# Patient Record
Sex: Male | Born: 1970 | Race: Black or African American | Hispanic: No | Marital: Married | Smoking: Current every day smoker
Health system: Southern US, Community
[De-identification: ages and names within clinical notes are randomized; demographics above are authoritative.]

## PROBLEM LIST (undated history)

## (undated) DIAGNOSIS — K219 Gastro-esophageal reflux disease without esophagitis: Secondary | ICD-10-CM

## (undated) DIAGNOSIS — M719 Bursopathy, unspecified: Secondary | ICD-10-CM

## (undated) DIAGNOSIS — E119 Type 2 diabetes mellitus without complications: Secondary | ICD-10-CM

## (undated) DIAGNOSIS — M545 Low back pain, unspecified: Secondary | ICD-10-CM

## (undated) DIAGNOSIS — I10 Essential (primary) hypertension: Secondary | ICD-10-CM

## (undated) DIAGNOSIS — K449 Diaphragmatic hernia without obstruction or gangrene: Secondary | ICD-10-CM

## (undated) DIAGNOSIS — G8929 Other chronic pain: Secondary | ICD-10-CM

## (undated) DIAGNOSIS — K2210 Ulcer of esophagus without bleeding: Secondary | ICD-10-CM

## (undated) DIAGNOSIS — E785 Hyperlipidemia, unspecified: Secondary | ICD-10-CM

## (undated) HISTORY — DX: Diaphragmatic hernia without obstruction or gangrene: K44.9

## (undated) HISTORY — DX: Low back pain: M54.5

## (undated) HISTORY — DX: Low back pain, unspecified: M54.50

## (undated) HISTORY — PX: SHOULDER SURGERY: SHX246

## (undated) HISTORY — DX: Hyperlipidemia, unspecified: E78.5

## (undated) HISTORY — PX: OTHER SURGICAL HISTORY: SHX169

## (undated) HISTORY — DX: Type 2 diabetes mellitus without complications: E11.9

## (undated) HISTORY — DX: Bursopathy, unspecified: M71.9

## (undated) HISTORY — PX: UMBILICAL HERNIA REPAIR: SHX196

## (undated) HISTORY — DX: Other chronic pain: G89.29

## (undated) HISTORY — PX: COLONOSCOPY: SHX174

## (undated) HISTORY — DX: Essential (primary) hypertension: I10

## (undated) HISTORY — DX: Ulcer of esophagus without bleeding: K22.10

---

## 2008-03-25 ENCOUNTER — Emergency Department (HOSPITAL_COMMUNITY): Admission: EM | Admit: 2008-03-25 | Discharge: 2008-03-25 | Payer: Self-pay | Admitting: Emergency Medicine

## 2008-03-26 ENCOUNTER — Emergency Department (HOSPITAL_COMMUNITY): Admission: EM | Admit: 2008-03-26 | Discharge: 2008-03-26 | Payer: Self-pay | Admitting: Emergency Medicine

## 2008-09-08 ENCOUNTER — Emergency Department (HOSPITAL_COMMUNITY): Admission: EM | Admit: 2008-09-08 | Discharge: 2008-09-08 | Payer: Self-pay | Admitting: Emergency Medicine

## 2008-09-08 ENCOUNTER — Telehealth (INDEPENDENT_AMBULATORY_CARE_PROVIDER_SITE_OTHER): Payer: Self-pay | Admitting: *Deleted

## 2008-09-09 ENCOUNTER — Encounter (INDEPENDENT_AMBULATORY_CARE_PROVIDER_SITE_OTHER): Payer: Self-pay | Admitting: *Deleted

## 2008-09-09 ENCOUNTER — Encounter: Payer: Self-pay | Admitting: Internal Medicine

## 2008-09-10 ENCOUNTER — Ambulatory Visit: Payer: Self-pay | Admitting: Internal Medicine

## 2008-09-10 ENCOUNTER — Encounter: Payer: Self-pay | Admitting: Internal Medicine

## 2008-09-10 ENCOUNTER — Ambulatory Visit (HOSPITAL_COMMUNITY): Admission: RE | Admit: 2008-09-10 | Discharge: 2008-09-10 | Payer: Self-pay | Admitting: Internal Medicine

## 2008-09-10 DIAGNOSIS — K2210 Ulcer of esophagus without bleeding: Secondary | ICD-10-CM

## 2008-09-10 HISTORY — DX: Ulcer of esophagus without bleeding: K22.10

## 2008-09-10 HISTORY — PX: ESOPHAGOGASTRODUODENOSCOPY: SHX1529

## 2008-12-09 ENCOUNTER — Ambulatory Visit: Payer: Self-pay | Admitting: Internal Medicine

## 2008-12-09 DIAGNOSIS — G43909 Migraine, unspecified, not intractable, without status migrainosus: Secondary | ICD-10-CM | POA: Insufficient documentation

## 2008-12-09 DIAGNOSIS — M549 Dorsalgia, unspecified: Secondary | ICD-10-CM | POA: Insufficient documentation

## 2008-12-09 DIAGNOSIS — J45909 Unspecified asthma, uncomplicated: Secondary | ICD-10-CM | POA: Insufficient documentation

## 2008-12-09 DIAGNOSIS — J40 Bronchitis, not specified as acute or chronic: Secondary | ICD-10-CM

## 2008-12-10 DIAGNOSIS — K219 Gastro-esophageal reflux disease without esophagitis: Secondary | ICD-10-CM

## 2009-02-06 ENCOUNTER — Ambulatory Visit: Payer: Self-pay | Admitting: Internal Medicine

## 2009-02-06 DIAGNOSIS — K210 Gastro-esophageal reflux disease with esophagitis: Secondary | ICD-10-CM

## 2009-06-25 ENCOUNTER — Emergency Department (HOSPITAL_COMMUNITY): Admission: EM | Admit: 2009-06-25 | Discharge: 2009-06-25 | Payer: Self-pay | Admitting: Emergency Medicine

## 2009-08-12 ENCOUNTER — Emergency Department (HOSPITAL_COMMUNITY): Admission: EM | Admit: 2009-08-12 | Discharge: 2009-08-12 | Payer: Self-pay | Admitting: Emergency Medicine

## 2010-03-09 ENCOUNTER — Encounter (INDEPENDENT_AMBULATORY_CARE_PROVIDER_SITE_OTHER): Payer: Self-pay | Admitting: *Deleted

## 2010-05-25 ENCOUNTER — Emergency Department (HOSPITAL_COMMUNITY): Admission: EM | Admit: 2010-05-25 | Discharge: 2010-05-25 | Payer: Self-pay | Admitting: Emergency Medicine

## 2010-05-25 ENCOUNTER — Ambulatory Visit: Payer: Self-pay | Admitting: Internal Medicine

## 2010-08-03 NOTE — Letter (Signed)
Summary: Recall Office Visit  [facility]  [address]   [place], [place] [address]   Phone: [phone]  Fax: [phone]      March 09, 2010   Gabriel Mendoza [address] [place], [place]  [address] [birth year]   Dear Mr. Creveling,   According to our records, it is time for you to schedule a follow-up office visit with [place].   At your convenience, please call [phone] to schedule an office visit. If you have any questions, concerns, or feel that this letter is in error, we would appreciate your call.   Sincerely,    Diana Eves  [facility] Ph: [phone]   Fax: [phone]
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
## 2010-10-14 LAB — URINALYSIS, ROUTINE W REFLEX MICROSCOPIC
Glucose, UA: NEGATIVE mg/dL
Hgb urine dipstick: NEGATIVE
Specific Gravity, Urine: 1.02 (ref 1.005–1.030)

## 2010-10-14 LAB — BASIC METABOLIC PANEL
BUN: 13 mg/dL (ref 6–23)
CO2: 24 mEq/L (ref 19–32)
GFR calc non Af Amer: >60 mL/min (ref >60)
Glucose, Bld: 125 mg/dL — ABNORMAL HIGH (ref 70–99)
Potassium: 3.7 mEq/L (ref 3.5–5.1)
Sodium: 137 mEq/L (ref 135–145)

## 2010-10-14 LAB — URINE MICROSCOPIC-ADD ON

## 2010-10-14 LAB — RAPID URINE DRUG SCREEN, HOSP PERFORMED
Cocaine: NOT DETECTED
Opiates: NOT DETECTED

## 2010-10-14 LAB — DIFFERENTIAL
Basophils Absolute: 0 10*3/uL (ref 0.0–0.1)
Basophils Relative: 0 % (ref 0–1)
Eosinophils Absolute: 0.2 10*3/uL (ref 0.0–0.7)
Eosinophils Relative: 1 % (ref 0–5)
Lymphocytes Relative: 10 % — ABNORMAL LOW (ref 12–46)
Monocytes Absolute: 1 10*3/uL (ref 0.1–1.0)

## 2010-10-14 LAB — CBC
HCT: 48.1 % (ref 39.0–52.0)
Hemoglobin: 16.2 g/dL (ref 13.0–17.0)
MCHC: 33.7 g/dL (ref 30.0–36.0)
MCV: 91.3 fL (ref 78.0–100.0)
Platelets: 236 10*3/uL (ref 150–400)
RDW: 14.8 % (ref 11.5–15.5)

## 2010-10-14 LAB — H. PYLORI ANTIBODY, IGG: H Pylori IgG: 0.5 {ISR}
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
## 2011-03-28 ENCOUNTER — Ambulatory Visit: Payer: Self-pay | Admitting: Gastroenterology

## 2011-03-30 ENCOUNTER — Ambulatory Visit: Payer: Self-pay | Admitting: Gastroenterology

## 2011-03-31 ENCOUNTER — Ambulatory Visit (INDEPENDENT_AMBULATORY_CARE_PROVIDER_SITE_OTHER): Payer: Medicaid - Out of State | Admitting: Urgent Care

## 2011-03-31 ENCOUNTER — Encounter: Payer: Self-pay | Admitting: Urgent Care

## 2011-03-31 DIAGNOSIS — R1310 Dysphagia, unspecified: Secondary | ICD-10-CM | POA: Insufficient documentation

## 2011-03-31 DIAGNOSIS — K219 Gastro-esophageal reflux disease without esophagitis: Secondary | ICD-10-CM

## 2011-03-31 MED ORDER — OMEPRAZOLE 20 MG PO CPDR: 20.0000 mg | DELAYED_RELEASE_CAPSULE | Freq: Two times a day (BID) | ORAL | Status: DC

## 2011-03-31 NOTE — Assessment & Plan Note (Signed)
Hx ulcerative reflux esophagitis, not on PPI.  Symptomatic. Encouraged to resume PPI. Trial Omeprazole 20mg  BID

## 2011-03-31 NOTE — Progress Notes (Signed)
No PCP on file 

## 2011-03-31 NOTE — Patient Instructions (Addendum)
1-800-Quit-Now to quit smoking Make an appt to set up a primary care physician Begin omeprazole (prilosec) 20md before breakfast & dinner. Call me in 2 weeks if acid reflux & swallowing is not better. Try to lose weight 1-2 # PER WEEK Office visit 3 months  Acid Reflux (GERD) Acid reflux is also called gastroesophageal reflux disease (GERD). Your stomach makes acid to help digest food. Acid reflux happens when acid from your stomach goes into the tube between your mouth and stomach (esophagus). Your stomach is protected from the acid, but this tube is not. When acid gets into the tube, it may cause a burning feeling in the chest (heartburn). Besides heartburn, other health problems can happen if the acid keeps going into the tube. Some causes of acid reflux include:  Being overweight.   Smoking.   Drinking alcohol.   Eating large meals.   Eating meals and then going to bed right away.   Eating certain foods.   Increased stomach acid production.  HOME CARE  Take all medicine as told by your doctor.   You may need to:   Lose weight.   Avoid alcohol.   Quit smoking.   Do not eat big meals. It is better to eat smaller meals throughout the day.   Do not eat a meal and then nap or go to bed.   Sleep with your head higher than your stomach.   Avoid foods that bother you.   You may need more tests, or you may need to see a special doctor.  GET HELP RIGHT AWAY IF:  You have chest pain that is different than before.   You have pain that goes to your arms, jaw, or between your shoulder blades.   You throw up (vomit) blood, dark brown liquid, or your throw up looks like coffee grounds.   You have trouble swallowing.   You have trouble breathing or cannot stop coughing.   You feel dizzy or pass out.   Your skin is cool, wet, and pale.   Your medicine is not helping.  MAKE SURE YOU:   Understand these instructions.   Will watch your condition.   Will get help  right away if you are not doing well or get worse.  Document Released: 12/07/2007 Document Re-Released: 09/14/2009 [facility] Patient Information 2011 [place], [place].
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
## 2011-03-31 NOTE — Assessment & Plan Note (Addendum)
Resume PPI, if no better, please call back to discuss EGD w/ possible esophageal dilation.  1-800-Quit-Now to quit smoking Make an appt to set up a primary care physician Begin omeprazole (prilosec) 20mg  before breakfast & dinner. Call me in 2 weeks if acid reflux & swallowing is not better. Try to lose weight 1-2 # PER WEEK Office visit 3 months Acid Reflux (GERD) literature

## 2011-05-24 ENCOUNTER — Encounter: Payer: Self-pay | Admitting: Gastroenterology

## 2011-05-30 NOTE — Progress Notes (Signed)
REVIEWED.  

## 2011-06-30 ENCOUNTER — Encounter: Payer: Self-pay | Admitting: Gastroenterology

## 2011-06-30 ENCOUNTER — Ambulatory Visit (INDEPENDENT_AMBULATORY_CARE_PROVIDER_SITE_OTHER): Payer: Medicaid - Out of State | Admitting: Gastroenterology

## 2011-06-30 ENCOUNTER — Ambulatory Visit: Payer: Medicaid - Out of State | Admitting: Gastroenterology

## 2011-06-30 ENCOUNTER — Ambulatory Visit: Payer: Medicaid - Out of State | Admitting: Urgent Care

## 2011-06-30 VITALS — BP 120/76 | HR 87 | Temp 99.0°F | Ht 72.0 in | Wt 263.4 lb

## 2011-06-30 DIAGNOSIS — R1013 Epigastric pain: Secondary | ICD-10-CM

## 2011-06-30 DIAGNOSIS — R1310 Dysphagia, unspecified: Secondary | ICD-10-CM

## 2011-06-30 NOTE — Patient Instructions (Addendum)
Continue to take Prilosec daily. Avoid Ibuprofen, Advil, Aleve, or anything of that nature. Just take tylenol products. You may want to try Zyrtec over-the-counter for sinus issues.   We have set you up for an upper endoscopy with Dr. Jena Gauss in the very near future.   Please have blood work completed. We will call you with the results.   Have a Happy New Year!
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
## 2011-07-01 DIAGNOSIS — R1013 Epigastric pain: Secondary | ICD-10-CM | POA: Insufficient documentation

## 2011-07-01 NOTE — Assessment & Plan Note (Addendum)
40 year old male with epigastric pain in the setting of NSAIDs 4 out of 7 days a week due to headaches. On Prilosec BID, no improvement in epigastric pain, GERD, dysphagia. Pain worsened with eating. Underlying nausea, worsened with eating as well. Question NSAID gastritis, PUD. Dysphagia may be due to uncontrolled GERD, esophageal web, ring, or stricture. Will proceed with EGD/ED in near future. Last EGD in early 2010 with hiatal hernia, excoriated proximal gastric mucosa, bulbar erosions, negative H.pylori.  Proceed with upper endoscopy in the near future with Dr. Jena Gauss. The risks, benefits, and alternatives have been discussed in detail with patient. They have stated understanding and desire to proceed.  CBC, CMP for completeness AVOID NSAIDs

## 2011-07-04 NOTE — Progress Notes (Signed)
No PCP on file 

## 2011-07-14 ENCOUNTER — Encounter (HOSPITAL_COMMUNITY): Payer: Self-pay | Admitting: Pharmacy Technician

## 2011-07-15 MED ORDER — SODIUM CHLORIDE 0.45 % IV SOLN
Freq: Once | INTRAVENOUS | Status: AC
  Administered 2011-07-18: 20 mL via INTRAVENOUS

## 2011-07-18 ENCOUNTER — Encounter (HOSPITAL_COMMUNITY)
Admission: RE | Disposition: A | Discharge status: Home / Self Care | Payer: Self-pay | Source: Ambulatory Visit | Attending: Internal Medicine

## 2011-07-18 ENCOUNTER — Ambulatory Visit (HOSPITAL_COMMUNITY)
Admission: RE | Admit: 2011-07-18 | Discharge: 2011-07-18 | Disposition: A | Discharge status: Home / Self Care | Payer: Medicaid - Out of State | Source: Ambulatory Visit | Attending: Internal Medicine | Admitting: Internal Medicine

## 2011-07-18 ENCOUNTER — Encounter (HOSPITAL_COMMUNITY): Payer: Self-pay | Admitting: *Deleted

## 2011-07-18 DIAGNOSIS — K2100 Gastro-esophageal reflux disease with esophagitis, without bleeding: Secondary | ICD-10-CM | POA: Insufficient documentation

## 2011-07-18 DIAGNOSIS — K219 Gastro-esophageal reflux disease without esophagitis: Secondary | ICD-10-CM

## 2011-07-18 DIAGNOSIS — K449 Diaphragmatic hernia without obstruction or gangrene: Secondary | ICD-10-CM | POA: Insufficient documentation

## 2011-07-18 DIAGNOSIS — R1310 Dysphagia, unspecified: Secondary | ICD-10-CM

## 2011-07-18 DIAGNOSIS — K222 Esophageal obstruction: Secondary | ICD-10-CM | POA: Insufficient documentation

## 2011-07-18 DIAGNOSIS — R1013 Epigastric pain: Secondary | ICD-10-CM

## 2011-07-18 DIAGNOSIS — K228 Other specified diseases of esophagus: Secondary | ICD-10-CM

## 2011-07-18 HISTORY — PX: ESOPHAGOGASTRODUODENOSCOPY: SHX1529

## 2011-07-18 HISTORY — DX: Gastro-esophageal reflux disease without esophagitis: K21.9

## 2011-07-18 SURGERY — ESOPHAGOGASTRODUODENOSCOPY (EGD) WITH ESOPHAGEAL DILATION
Anesthesia: Moderate Sedation

## 2011-07-18 MED ORDER — MIDAZOLAM HCL 5 MG/5ML IJ SOLN
INTRAMUSCULAR | Status: AC
  Filled 2011-07-18: qty 10

## 2011-07-18 MED ORDER — BUTAMBEN-TETRACAINE-BENZOCAINE 2-2-14 % EX AERO
INHALATION_SPRAY | CUTANEOUS | Status: DC | PRN
  Administered 2011-07-18: 2 via TOPICAL

## 2011-07-18 MED ORDER — MEPERIDINE HCL 100 MG/ML IJ SOLN
INTRAMUSCULAR | Status: AC
  Filled 2011-07-18: qty 2

## 2011-07-18 MED ORDER — MEPERIDINE HCL 100 MG/ML IJ SOLN
INTRAMUSCULAR | Status: DC | PRN
  Administered 2011-07-18 (×2): 50 mg via INTRAVENOUS

## 2011-07-18 MED ORDER — STERILE WATER FOR IRRIGATION IR SOLN
Status: DC | PRN
  Administered 2011-07-18: 12:00:00

## 2011-07-18 MED ORDER — MIDAZOLAM HCL 5 MG/5ML IJ SOLN
INTRAMUSCULAR | Status: DC | PRN
  Administered 2011-07-18: 1 mg via INTRAVENOUS
  Administered 2011-07-18 (×3): 2 mg via INTRAVENOUS

## 2011-07-18 NOTE — Op Note (Signed)
[facility] [address] [place], [place]  [address]  ENDOSCOPY PROCEDURE REPORT  PATIENT:  Gabriel Mendoza, Gabriel Mendoza  MR#:  [id number] BIRTHDATE:  1970/10/30, 40 yrs. old  GENDER:  male  ENDOSCOPIST:  R. Roetta Sessions, MD FACP [facility] Referred by:          self  PROCEDURE DATE:  07/18/2011 PROCEDURE:  EGD with Elease Hashimoto dilation  INDICATIONS:  refractory GERD/esophageal dysphagia  INFORMED CONSENT:   The risks, benefits, limitations, alternatives and imponderables have been discussed.  The potential for biopsy, esophogeal dilation, etc. have also been reviewed.  Questions have been answered.  All parties agreeable.  Please see the history and physical in the medical record for more information.  MEDICATIONS:Versed 7 mg IV and Demerol 100 mg IV in divided doses. Cetacaine spray.  DESCRIPTION OF PROCEDURE:   The EG-[id number] ([id number]) endoscope was introduced through the mouth and advanced to the second portion of the duodenum without difficulty or limitations.  The mucosal surfaces were surveyed very carefully during advancement of the scope and upon withdrawal.  Retroflexion view of the proximal stomach and esophagogastric junction was performed.  <<PROCEDUREIMAGES>>  FINDINGS:   four-quadrant distal esophageal erosions.  No obvious obstructing lesion. Small hiatal hernia; otherwise normal stomach. Normal first and second portion of the duodenum.  THERAPEUTIC / DIAGNOSTIC MANEUVERS PERFORMED:  A 56 French Maloney dilator was passed the full insertion easily. A look back revealed a superficial tear through the upper esophageal sphincter.  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 Four-quadrant distal esophageal erosions consistent with mild erosive reflux esophagitis. Small hiatal hernia.  RECOMMENDATIONS:    Stop omeprazole; begin Dexilant 60 mg orally daily. Go by my office for free  samples.  ______________________________ R. Roetta Sessions, MD Caleen Essex  CC:  n. eSIGNED:   R. Roetta Sessions at 07/18/2011 12:02 PM  Alvera Novel,   [phone]

## 2011-07-18 NOTE — H&P (Signed)
  I have seen & examined the patient prior to the procedure(s) today and reviewed the history and physical/consultation.  There have been no changes.  After consideration of the risks, benefits, alternatives and imponderables, the patient has consented to the procedure(s).   

## 2011-11-01 ENCOUNTER — Encounter: Payer: Self-pay | Admitting: Gastroenterology

## 2011-11-01 NOTE — Progress Notes (Unsigned)
Let's offer routine f/u, had EGD in Jan 2013. Need to see how doing.

## 2011-11-24 ENCOUNTER — Encounter: Payer: Self-pay | Admitting: Gastroenterology

## 2011-11-24 NOTE — Progress Notes (Signed)
Mailed letter for patient to call to set up OV

## 2011-12-26 ENCOUNTER — Ambulatory Visit: Payer: Medicaid - Out of State | Admitting: Urgent Care

## 2011-12-30 ENCOUNTER — Ambulatory Visit: Payer: Medicaid - Out of State | Admitting: Urgent Care

## 2012-01-18 ENCOUNTER — Encounter: Payer: Self-pay | Admitting: Internal Medicine

## 2012-01-19 ENCOUNTER — Encounter: Payer: Self-pay | Admitting: Urgent Care

## 2012-01-19 ENCOUNTER — Ambulatory Visit (INDEPENDENT_AMBULATORY_CARE_PROVIDER_SITE_OTHER): Payer: Medicaid - Out of State | Admitting: Urgent Care

## 2012-01-19 VITALS — BP 112/72 | HR 86 | Temp 97.9°F | Ht 72.0 in | Wt 256.2 lb

## 2012-01-19 DIAGNOSIS — K219 Gastro-esophageal reflux disease without esophagitis: Secondary | ICD-10-CM

## 2012-01-19 DIAGNOSIS — E669 Obesity, unspecified: Secondary | ICD-10-CM | POA: Insufficient documentation

## 2012-01-19 MED ORDER — OMEPRAZOLE 20 MG PO CPDR: 20.0000 mg | DELAYED_RELEASE_CAPSULE | Freq: Two times a day (BID) | ORAL | Status: DC

## 2012-01-19 NOTE — Assessment & Plan Note (Addendum)
Stable.  Rare breakthrough w/ dietary indiscretions. Continue omeprazole 20mg  before breakfast & dinner for 3 months, at that time try once daily Office visit in 6 mo Weight loss (see obesity)

## 2012-01-19 NOTE — Assessment & Plan Note (Signed)
Encouraged wt loss Recommend 1-2# weight loss per week until ideal body weight through exercise & diet. Low fat/cholesterol diet. Gradually increase exercise from 15 min daily up to 1 hr per day 5 days/week. Limit alcohol use.
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
## 2012-01-19 NOTE — Progress Notes (Signed)
No PCP on file 

## 2012-01-19 NOTE — Patient Instructions (Addendum)
Recommend 1-2# weight loss per week until ideal body weight through exercise & diet. Low fat/cholesterol diet. Gradually increase exercise from 15 min daily up to 1 hr per day 5 days/week. Limit alcohol use. Continue omeprazole 20mg  before breakfast & dinner for 3 months, at that time try once daily Office visit in 6 mo LOW-SATURATED FAT / LOW-FAT FOOD SUBSTITUTES Meats / Saturated Fat (g)  Avoid: Steak, marbled (3 oz/85 g) / 11 g   Choose: Steak, lean (3 oz/85 g) / 4 g   Avoid: Hamburger (3 oz/85 g) / 7 g   Choose: Hamburger, lean (3 oz/85 g) / 5 g   Avoid: Ham (3 oz/85 g) / 6 g   Choose: Ham, lean cut (3 oz/85 g) / 2.4 g   Avoid: Chicken, with skin, dark meat (3 oz/85 g) / 4 g   Choose: Chicken, skin removed, dark meat (3 oz/85 g) / 2 g   Avoid: Chicken, with skin, light meat (3 oz/85 g) / 2.5 g   Choose: Chicken, skin removed, light meat (3 oz/85 g) / 1 g  Dairy / Saturated Fat (g)  Avoid: Whole milk (1 cup) / 5 g   Choose: Low-fat milk, 2% (1 cup) / 3 g   Choose: Low-fat milk, 1% (1 cup) / 1.5 g   Choose: Skim milk (1 cup) / 0.3 g   Avoid: Hard cheese (1 oz/28 g) / 6 g   Choose: Skim milk cheese (1 oz/28 g) / 2 to 3 g   Avoid: Cottage cheese, 4% fat (1 cup) / 6.5 g   Choose: Low-fat cottage cheese, 1% fat (1 cup) / 1.5 g   Avoid: Ice cream (1 cup) / 9 g   Choose: Sherbet (1 cup) / 2.5 g   Choose: Nonfat frozen yogurt (1 cup) / 0.3 g   Choose: Frozen fruit bar / trace   Avoid: Whipped cream (1 tbs) / 3.5 g   Choose: Nondairy whipped topping (1 tbs) / 1 g  Condiments / Saturated Fat (g)  Avoid: Mayonnaise (1 tbs) / 2 g   Choose: Low-fat mayonnaise (1 tbs) / 1 g   Avoid: Butter (1 tbs) / 7 g   Choose: Extra light margarine (1 tbs) / 1 g   Avoid: Coconut oil (1 tbs) / 11.8 g   Choose: Olive oil (1 tbs) / 1.8 g   Choose: Corn oil (1 tbs) / 1.7 g   Choose: Safflower oil (1 tbs) / 1.2 g   Choose: Sunflower oil (1 tbs) / 1.4 g   Choose: Soybean  oil (1 tbs) / 2.4 g   Choose: Canola oil (1 tbs) / 1 g  Document Released: 06/20/2005 Document Revised: 06/09/2011 Document Reviewed: 12/09/2010 [facility] Patient Information 2012 [place], [place].

## 2012-01-28 ENCOUNTER — Emergency Department (HOSPITAL_COMMUNITY)
Admission: EM | Admit: 2012-01-28 | Discharge: 2012-01-28 | Disposition: A | Discharge status: Home / Self Care | Payer: Medicaid - Out of State | Attending: Emergency Medicine | Admitting: Emergency Medicine

## 2012-01-28 ENCOUNTER — Encounter (HOSPITAL_COMMUNITY): Payer: Self-pay

## 2012-01-28 ENCOUNTER — Emergency Department (HOSPITAL_COMMUNITY): Payer: Medicaid - Out of State

## 2012-01-28 DIAGNOSIS — F17200 Nicotine dependence, unspecified, uncomplicated: Secondary | ICD-10-CM | POA: Insufficient documentation

## 2012-01-28 DIAGNOSIS — M5430 Sciatica, unspecified side: Secondary | ICD-10-CM

## 2012-01-28 DIAGNOSIS — M545 Low back pain, unspecified: Secondary | ICD-10-CM | POA: Insufficient documentation

## 2012-01-28 DIAGNOSIS — K219 Gastro-esophageal reflux disease without esophagitis: Secondary | ICD-10-CM | POA: Insufficient documentation

## 2012-01-28 DIAGNOSIS — G8929 Other chronic pain: Secondary | ICD-10-CM | POA: Insufficient documentation

## 2012-01-28 MED ORDER — KETOROLAC TROMETHAMINE 60 MG/2ML IM SOLN
60.0000 mg | Freq: Once | INTRAMUSCULAR | Status: AC
  Administered 2012-01-28: 60 mg via INTRAMUSCULAR
  Filled 2012-01-28: qty 2

## 2012-01-28 MED ORDER — OXYCODONE-ACETAMINOPHEN 5-325 MG PO TABS: 1.0000 | ORAL_TABLET | ORAL | Status: AC | PRN

## 2012-01-28 MED ORDER — CYCLOBENZAPRINE HCL 10 MG PO TABS: 10.0000 mg | ORAL_TABLET | Freq: Three times a day (TID) | ORAL | Status: AC | PRN

## 2012-01-28 MED ORDER — OXYCODONE-ACETAMINOPHEN 5-325 MG PO TABS
1.0000 | ORAL_TABLET | Freq: Once | ORAL | Status: AC
  Administered 2012-01-28: 1 via ORAL
  Filled 2012-01-28: qty 1

## 2012-01-28 MED ORDER — NAPROXEN 500 MG PO TABS: 500.0000 mg | ORAL_TABLET | Freq: Two times a day (BID) | ORAL | Status: AC

## 2012-01-28 NOTE — ED Notes (Signed)
Pt c/o back pain that radiates down his leg.

## 2012-01-28 NOTE — ED Notes (Signed)
Low back pain, pain radiates to right leg. Denies any known inury
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
## 2012-02-08 NOTE — ED Provider Notes (Signed)
Medical screening examination/treatment/procedure(s) were performed by non-physician practitioner and as supervising physician I was immediately available for consultation/collaboration.  Donnetta Hutching, MD 02/08/12 1510

## 2013-08-28 ENCOUNTER — Encounter (HOSPITAL_COMMUNITY): Payer: Self-pay | Admitting: Emergency Medicine

## 2013-08-28 ENCOUNTER — Emergency Department (HOSPITAL_COMMUNITY)
Admission: EM | Admit: 2013-08-28 | Discharge: 2013-08-28 | Disposition: A | Discharge status: Home / Self Care | Payer: Medicaid - Out of State | Attending: Emergency Medicine | Admitting: Emergency Medicine

## 2013-08-28 ENCOUNTER — Emergency Department (HOSPITAL_COMMUNITY): Payer: Medicaid - Out of State

## 2013-08-28 DIAGNOSIS — Y9389 Activity, other specified: Secondary | ICD-10-CM | POA: Insufficient documentation

## 2013-08-28 DIAGNOSIS — M25469 Effusion, unspecified knee: Secondary | ICD-10-CM | POA: Insufficient documentation

## 2013-08-28 DIAGNOSIS — F17200 Nicotine dependence, unspecified, uncomplicated: Secondary | ICD-10-CM | POA: Insufficient documentation

## 2013-08-28 DIAGNOSIS — X500XXA Overexertion from strenuous movement or load, initial encounter: Secondary | ICD-10-CM | POA: Insufficient documentation

## 2013-08-28 DIAGNOSIS — Y929 Unspecified place or not applicable: Secondary | ICD-10-CM | POA: Insufficient documentation

## 2013-08-28 DIAGNOSIS — S99929A Unspecified injury of unspecified foot, initial encounter: Principal | ICD-10-CM

## 2013-08-28 DIAGNOSIS — S99919A Unspecified injury of unspecified ankle, initial encounter: Principal | ICD-10-CM

## 2013-08-28 DIAGNOSIS — J45909 Unspecified asthma, uncomplicated: Secondary | ICD-10-CM | POA: Insufficient documentation

## 2013-08-28 DIAGNOSIS — G8929 Other chronic pain: Secondary | ICD-10-CM | POA: Insufficient documentation

## 2013-08-28 DIAGNOSIS — S8990XA Unspecified injury of unspecified lower leg, initial encounter: Secondary | ICD-10-CM | POA: Insufficient documentation

## 2013-08-28 DIAGNOSIS — Z8719 Personal history of other diseases of the digestive system: Secondary | ICD-10-CM | POA: Insufficient documentation

## 2013-08-28 DIAGNOSIS — M25462 Effusion, left knee: Secondary | ICD-10-CM

## 2013-08-28 MED ORDER — HYDROCODONE-ACETAMINOPHEN 5-325 MG PO TABS: 1.0000 | ORAL_TABLET | Freq: Four times a day (QID) | ORAL | Status: DC | PRN

## 2013-08-28 MED ORDER — HYDROCODONE-ACETAMINOPHEN 5-325 MG PO TABS
1.0000 | ORAL_TABLET | Freq: Once | ORAL | Status: AC
  Administered 2013-08-28: 1 via ORAL
  Filled 2013-08-28: qty 1

## 2013-08-28 MED ORDER — IBUPROFEN 600 MG PO TABS: 600.0000 mg | ORAL_TABLET | Freq: Four times a day (QID) | ORAL | Status: DC | PRN

## 2013-08-28 NOTE — Discharge Instructions (Signed)
Knee Effusion  Knee effusion means you have fluid in your knee. The knee may be more difficult to bend and move. HOME CARE  Use crutches or a brace as told by your doctor.  Put ice on the injured area.  Put ice in a plastic bag.  Place a towel between your skin and the bag.  Leave the ice on for 15-20 minutes, 03-04 times a day.  Raise (elevate) your knee as much as possible.  Only take medicine as told by your doctor.  You may need to do strengthening exercises. Ask your doctor.  Continue with your normal diet and activities as told by your doctor. GET HELP RIGHT AWAY IF:  You have more puffiness (swelling) in your knee.  You see redness, puffiness, or have more pain in your knee.  You have a temperature by mouth above 102 F (38.9 C).  You get a rash.  You have trouble breathing.  You have a reaction to any medicine you are taking.  You have a lot of pain when you move your knee. MAKE SURE YOU:  Understand these instructions.  Will watch your condition.  Will get help right away if you are not doing well or get worse. Document Released: 07/23/2010 Document Revised: 09/12/2011 Document Reviewed: 07/23/2010 [facility] Patient Information 2014 [place].   Use your knee immobilizer and crutches as discussed to minimize weightbearing and movement of your knee.  Use the medications prescribed.  Apply heat to your knee for 20 minutes 3 times daily.  Call Dr. Romeo AppleHarrison for further management of your injury.

## 2013-08-28 NOTE — ED Notes (Signed)
Getting out of bed a few weeks ago  and felt a pop in left knee.  Pain left knee
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
## 2013-09-10 NOTE — ED Provider Notes (Signed)
Medical screening examination/treatment/procedure(s) were performed by non-physician practitioner and as supervising physician I was immediately available for consultation/collaboration.   EKG Interpretation None        Rolland PorterMark Ysabel Stankovich, MD 09/10/13 [phone]

## 2020-01-19 ENCOUNTER — Emergency Department (HOSPITAL_COMMUNITY)
Admission: EM | Admit: 2020-01-19 | Discharge: 2020-01-19 | Disposition: A | Discharge status: Home / Self Care | Payer: Medicaid - Out of State | Attending: Emergency Medicine | Admitting: Emergency Medicine

## 2020-01-19 ENCOUNTER — Other Ambulatory Visit: Payer: Self-pay

## 2020-01-19 ENCOUNTER — Encounter (HOSPITAL_COMMUNITY): Payer: Self-pay

## 2020-01-19 DIAGNOSIS — M79604 Pain in right leg: Secondary | ICD-10-CM | POA: Insufficient documentation

## 2020-01-19 DIAGNOSIS — F17210 Nicotine dependence, cigarettes, uncomplicated: Secondary | ICD-10-CM | POA: Insufficient documentation

## 2020-01-19 DIAGNOSIS — G8929 Other chronic pain: Secondary | ICD-10-CM | POA: Insufficient documentation

## 2020-01-19 DIAGNOSIS — M5431 Sciatica, right side: Secondary | ICD-10-CM | POA: Insufficient documentation

## 2020-01-19 DIAGNOSIS — M25511 Pain in right shoulder: Secondary | ICD-10-CM | POA: Insufficient documentation

## 2020-01-19 DIAGNOSIS — J45909 Unspecified asthma, uncomplicated: Secondary | ICD-10-CM | POA: Insufficient documentation

## 2020-01-19 MED ORDER — PREDNISONE 10 MG PO TABS
60.0000 mg | ORAL_TABLET | Freq: Once | ORAL | Status: AC
  Administered 2020-01-19: 60 mg via ORAL
  Filled 2020-01-19: qty 1

## 2020-01-19 MED ORDER — PREDNISONE 10 MG PO TABS: 60.0000 mg | ORAL_TABLET | Freq: Every day | ORAL | 0 refills | Status: AC

## 2020-01-19 MED ORDER — IBUPROFEN 800 MG PO TABS
800.0000 mg | ORAL_TABLET | Freq: Once | ORAL | Status: AC
  Administered 2020-01-19: 800 mg via ORAL
  Filled 2020-01-19: qty 1

## 2020-01-19 MED ORDER — IBUPROFEN 600 MG PO TABS: 600.0000 mg | ORAL_TABLET | Freq: Four times a day (QID) | ORAL | 0 refills | Status: DC | PRN

## 2020-01-19 NOTE — ED Triage Notes (Signed)
Pt reports pain in r lower back radiating down r leg.  Denies injury, denies urinary symptoms.

## 2020-01-19 NOTE — Discharge Instructions (Signed)
Please call your orthopedic doctor (Dr Duke Salvia) or your primary care doctor to talk about your back pain.  This is a chronic issue that will need ongoing treatment.  We started you on steroids and motrin today.  You can buy lidocaine cream or patches over the counter at most pharmacies and use this on your lower back.  Usually the pain resolves in 4-6 weeks if you do not injure yourself.
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
## 2020-08-09 ENCOUNTER — Other Ambulatory Visit: Payer: Self-pay

## 2020-08-09 ENCOUNTER — Emergency Department (HOSPITAL_COMMUNITY)
Admission: EM | Admit: 2020-08-09 | Discharge: 2020-08-09 | Disposition: A | Discharge status: Home / Self Care | Payer: HRSA Program | Attending: Emergency Medicine | Admitting: Emergency Medicine

## 2020-08-09 ENCOUNTER — Encounter (HOSPITAL_COMMUNITY): Payer: Self-pay | Admitting: *Deleted

## 2020-08-09 DIAGNOSIS — F17210 Nicotine dependence, cigarettes, uncomplicated: Secondary | ICD-10-CM | POA: Diagnosis not present

## 2020-08-09 DIAGNOSIS — J45909 Unspecified asthma, uncomplicated: Secondary | ICD-10-CM | POA: Insufficient documentation

## 2020-08-09 DIAGNOSIS — U071 COVID-19: Secondary | ICD-10-CM | POA: Insufficient documentation

## 2020-08-09 DIAGNOSIS — M791 Myalgia, unspecified site: Secondary | ICD-10-CM | POA: Diagnosis present

## 2020-08-09 LAB — COMPREHENSIVE METABOLIC PANEL
ALT: 57 U/L — ABNORMAL HIGH (ref 0–44)
AST: 57 U/L — ABNORMAL HIGH (ref 15–41)
Albumin: 3.6 g/dL (ref 3.5–5.0)
Alkaline Phosphatase: 54 U/L (ref 38–126)
Anion gap: 9 (ref 5–15)
BUN: 9 mg/dL (ref 6–20)
CO2: 23 mmol/L (ref 22–32)
Calcium: 7.8 mg/dL — ABNORMAL LOW (ref 8.9–10.3)
Chloride: 95 mmol/L — ABNORMAL LOW (ref 98–111)
Creatinine, Ser: 1.11 mg/dL (ref 0.61–1.24)
GFR, Estimated: >60 mL/min (ref >60)
Glucose, Bld: 103 mg/dL — ABNORMAL HIGH (ref 70–99)
Potassium: 3.3 mmol/L — ABNORMAL LOW (ref 3.5–5.1)
Sodium: 127 mmol/L — ABNORMAL LOW (ref 135–145)
Total Bilirubin: 0.5 mg/dL (ref 0.3–1.2)
Total Protein: 7.4 g/dL (ref 6.5–8.1)

## 2020-08-09 LAB — CBC
HCT: 48 % (ref 39.0–52.0)
Hemoglobin: 16.1 g/dL (ref 13.0–17.0)
MCH: 31.5 pg (ref 26.0–34.0)
MCHC: 33.5 g/dL (ref 30.0–36.0)
MCV: 93.9 fL (ref 80.0–100.0)
Platelets: 205 10*3/uL (ref 150–400)
RBC: 5.11 MIL/uL (ref 4.22–5.81)
RDW: 15.1 % (ref 11.5–15.5)
WBC: 6 10*3/uL (ref 4.0–10.5)
nRBC: 0 % (ref 0.0–0.2)

## 2020-08-09 MED ORDER — POTASSIUM CHLORIDE CRYS ER 20 MEQ PO TBCR
40.0000 meq | EXTENDED_RELEASE_TABLET | Freq: Once | ORAL | Status: AC
Start: 2020-08-09 — End: 2020-08-09
  Administered 2020-08-09: 40 meq via ORAL
  Filled 2020-08-09: qty 2

## 2020-08-09 MED ORDER — SODIUM CHLORIDE 0.9 % IV BOLUS
500.0000 mL | Freq: Once | INTRAVENOUS | Status: AC
  Administered 2020-08-09: 500 mL via INTRAVENOUS

## 2020-08-09 MED ORDER — SODIUM CHLORIDE 0.9 % IV BOLUS
1000.0000 mL | Freq: Once | INTRAVENOUS | Status: AC
  Administered 2020-08-09: 1000 mL via INTRAVENOUS

## 2020-08-09 MED ORDER — IBUPROFEN 800 MG PO TABS
800.0000 mg | ORAL_TABLET | Freq: Once | ORAL | Status: AC
  Administered 2020-08-09: 800 mg via ORAL
  Filled 2020-08-09: qty 1

## 2020-08-09 MED ORDER — ACETAMINOPHEN 500 MG PO TABS
1000.0000 mg | ORAL_TABLET | Freq: Once | ORAL | Status: AC
  Administered 2020-08-09: 1000 mg via ORAL
  Filled 2020-08-09: qty 2
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
## 2020-08-09 NOTE — Discharge Instructions (Signed)
You are seen today for body aches and fever, as we discussed you most likely have COVID-19.  Your blood work does support this, your sodium and potassium were slightly low, we did try and replete these in the emergency department, however want you to get these rechecked with your primary care in the next couple of days.  If your Covid test is negative and if you have any worsening symptoms you need to come back to the emergency department or follow-up with your PCP.  If your Covid test is positive, follow-up with your PCP over telehealth, take Tylenol started on the bottle for pain, and isolate according to CDC guidelines.  If you have any shortness of breath or chest pain or if you have any new or worsening concerning symptoms please come back to the emergency department.

## 2020-08-09 NOTE — ED Notes (Addendum)
Pa notified of d/c vitals and temp, motrin ordered, motrin given, pt states that he is ready to go home, d/c iv, cath intact, pt verbalized understanding d/c instructions.

## 2020-08-09 NOTE — ED Triage Notes (Signed)
Pt with body aches and HA x 3 days. Denies any known covid exposure.  + chills Friday and Saturday. Fever noted in triage and has not taken anything for it.  Denies any cough, N/v/D.

## 2020-08-10 ENCOUNTER — Telehealth (HOSPITAL_COMMUNITY): Payer: Self-pay

## 2020-08-10 LAB — SARS CORONAVIRUS 2 (TAT 6-24 HRS): SARS Coronavirus 2: POSITIVE — AB

## 2020-08-11 ENCOUNTER — Telehealth: Payer: Self-pay | Admitting: Family

## 2020-08-11 NOTE — Telephone Encounter (Signed)
Called to discuss with patient about COVID-19 symptoms and the use of one of the available treatments for those with mild to moderate Covid symptoms and at a high risk of hospitalization.  Pt appears to qualify for outpatient treatment due to co-morbid conditions and/or a member of an at-risk group in accordance with the FDA Emergency Use Authorization.    Symptom onset: 08/07/20 Vaccinated: No Booster? No Immunocompromised? No Qualifiers: ethnicity, BMI  Tells me he feels a "whole lot better". Tells me he is still having lower back pain which he has been following with orthopedica, known lumbosacral radiculopathy. Low suspicion this is related to his COVID 19. Denies shortness of breath, nausea, diarrhea, congestion. As he is asymptomatic from COVID19 at this time, no indication for monoclonal antibody therapy  Alver Sorrow

## 2021-05-02 ENCOUNTER — Emergency Department (HOSPITAL_COMMUNITY)
Admission: EM | Admit: 2021-05-02 | Discharge: 2021-05-02 | Disposition: A | Discharge status: Home / Self Care | Payer: 59 | Attending: Student | Admitting: Student

## 2021-05-02 ENCOUNTER — Emergency Department (HOSPITAL_COMMUNITY): Payer: 59

## 2021-05-02 ENCOUNTER — Encounter (HOSPITAL_COMMUNITY): Payer: Self-pay

## 2021-05-02 DIAGNOSIS — J45909 Unspecified asthma, uncomplicated: Secondary | ICD-10-CM | POA: Diagnosis not present

## 2021-05-02 DIAGNOSIS — S0990XA Unspecified injury of head, initial encounter: Secondary | ICD-10-CM | POA: Diagnosis not present

## 2021-05-02 DIAGNOSIS — M545 Low back pain, unspecified: Secondary | ICD-10-CM | POA: Diagnosis not present

## 2021-05-02 DIAGNOSIS — S161XXA Strain of muscle, fascia and tendon at neck level, initial encounter: Secondary | ICD-10-CM

## 2021-05-02 DIAGNOSIS — W01198A Fall on same level from slipping, tripping and stumbling with subsequent striking against other object, initial encounter: Secondary | ICD-10-CM | POA: Insufficient documentation

## 2021-05-02 DIAGNOSIS — F17210 Nicotine dependence, cigarettes, uncomplicated: Secondary | ICD-10-CM | POA: Insufficient documentation

## 2021-05-02 DIAGNOSIS — S199XXA Unspecified injury of neck, initial encounter: Secondary | ICD-10-CM | POA: Diagnosis present

## 2021-05-02 MED ORDER — HYDROCODONE-ACETAMINOPHEN 5-325 MG PO TABS: 1.0000 | ORAL_TABLET | ORAL | 0 refills | Status: DC | PRN

## 2021-05-02 NOTE — ED Notes (Signed)
Patient transported to X-ray 
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
## 2021-05-02 NOTE — ED Triage Notes (Signed)
Pt. States they fell and hit their head yesterday onto the ground. Pt. States their head, neck and back hurts.

## 2022-07-06 IMAGING — DX DG FINGER MIDDLE 2+V*L*
3 series · 3 of 3 positions shown · non-contrast
Comparison: None.

CLINICAL DATA: Fell yesterday and injured left middle finger.

EXAM:
LEFT MIDDLE FINGER 2+V

[finger ap]
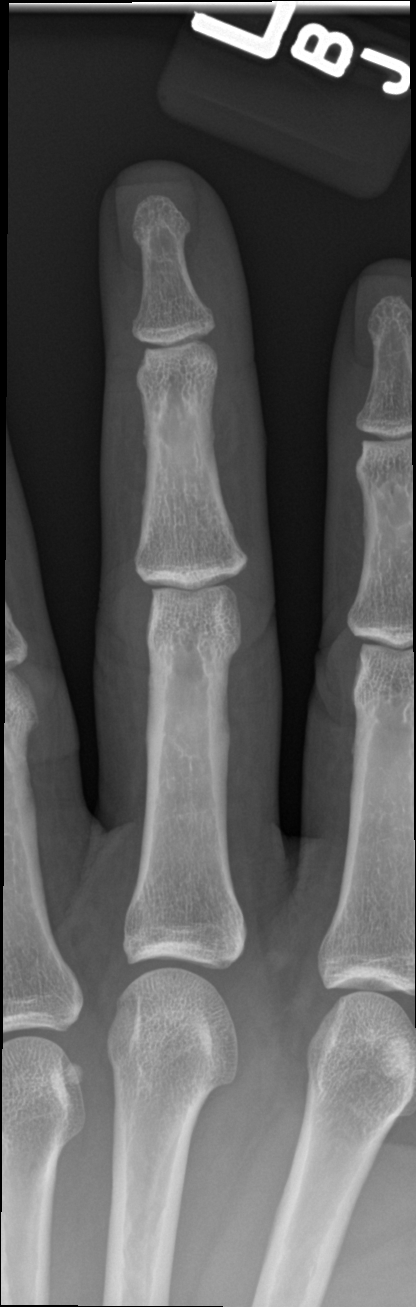

[finger obl]
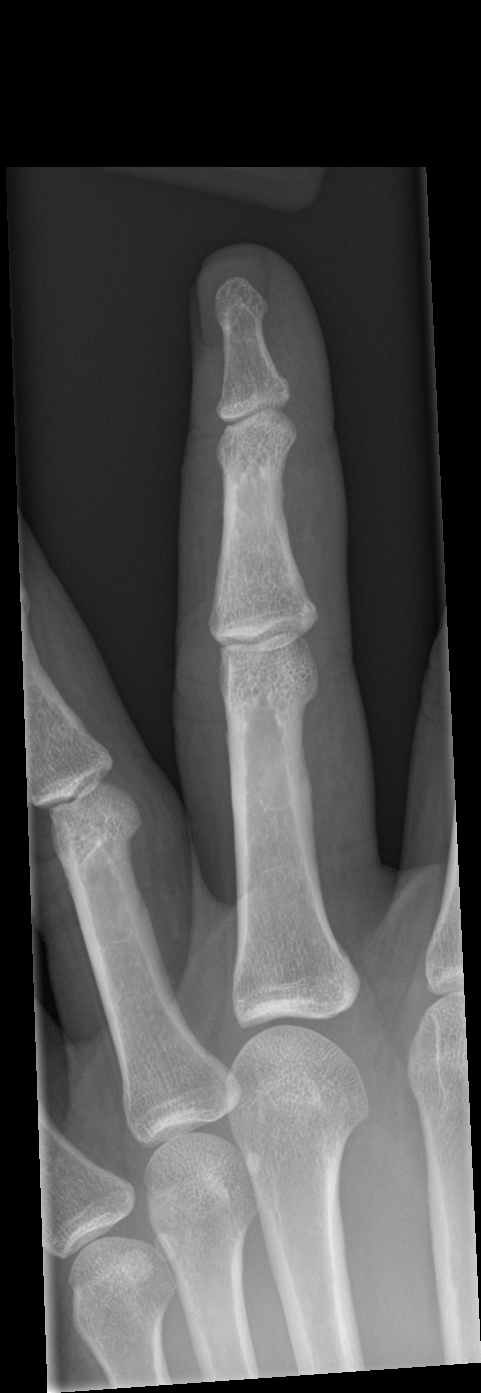

[finger lat]
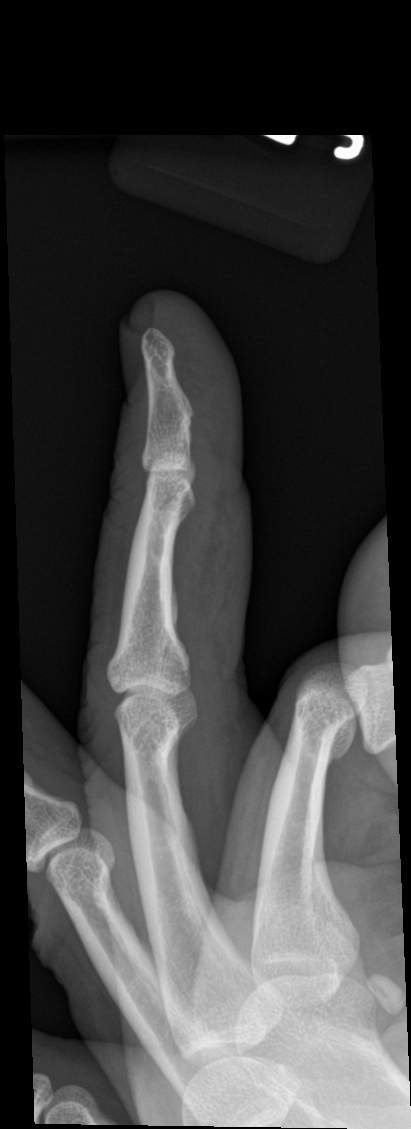

[3 of 3 positions shown; findings below may reference images not displayed]

FINDINGS: The joint spaces are maintained. No acute fracture is identified.
Moderate soft tissue swelling is noted.
IMPRESSION: No acute bony findings.  Of

## 2022-07-06 IMAGING — CT CT CERVICAL SPINE W/O CM
4 of 10 series · 10 of 33 positions shown, 11 images · non-contrast
Comparison: Radiographs of the cervical spine 03/25/2008 (images
unavailable).

CLINICAL DATA: Head trauma, loss of consciousness. Additional
history provided: Patient reports fall (hitting head on ground)
yesterday. Head and neck pain.

EXAM:
CT HEAD WITHOUT CONTRAST
CT CERVICAL SPINE WITHOUT CONTRAST
TECHNIQUE: Multidetector CT imaging of the head and cervical spine was
performed following the standard protocol without intravenous
contrast. Multiplanar CT image reconstructions of the cervical spine
were also generated.

[Series 6: coronal soft · coronal · 0.31mm/px · 1 of 81 slices shown]
[im 41/81  bone]
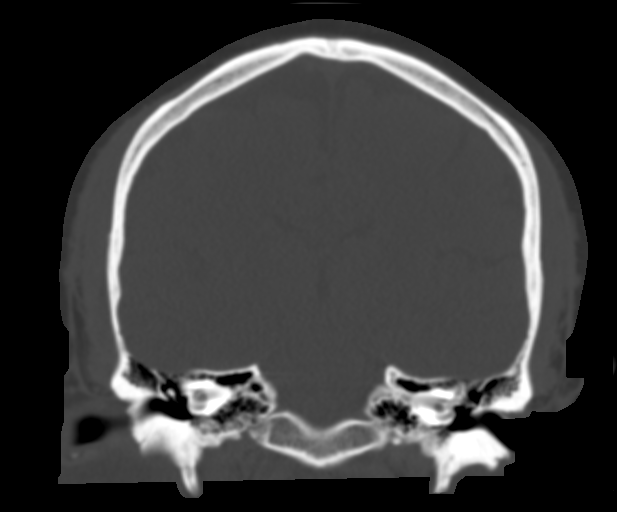

[Series 9: c spine soft · axial · 0.36mm/px · z∈[+152,+270]mm · 4 of 99 slices shown]
[im 20/99  soft-tissue]
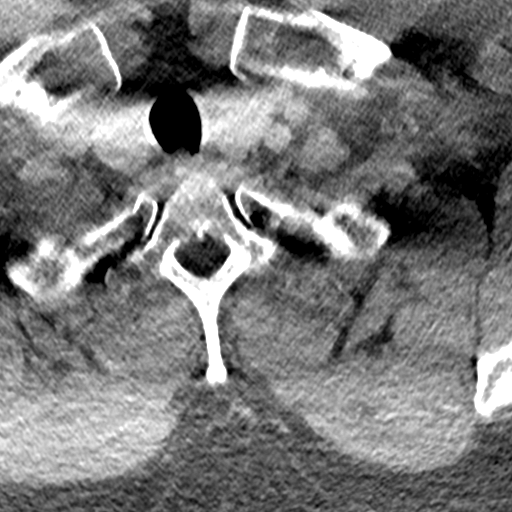
[im 40/99  soft-tissue]
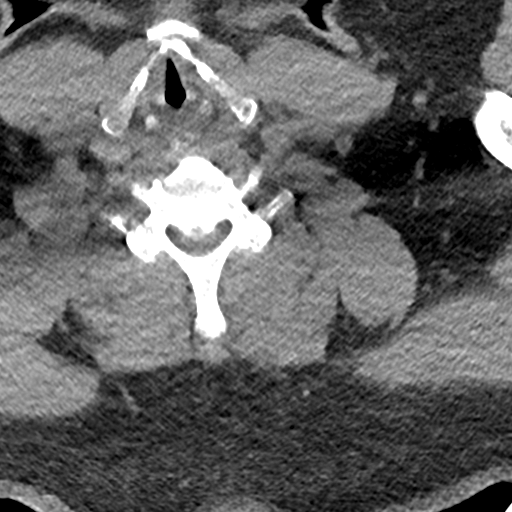
[im 59/99  soft-tissue]
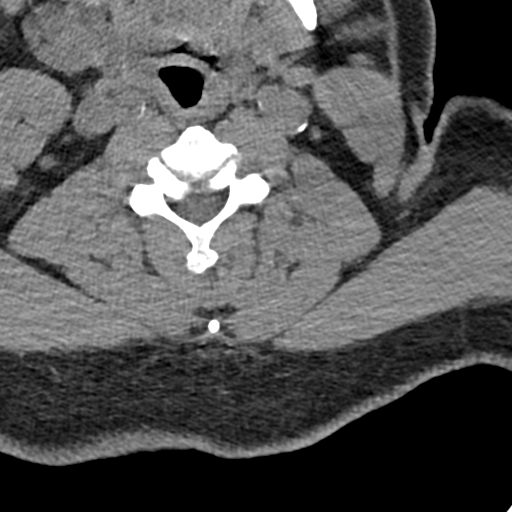
[im 79/99  soft-tissue]
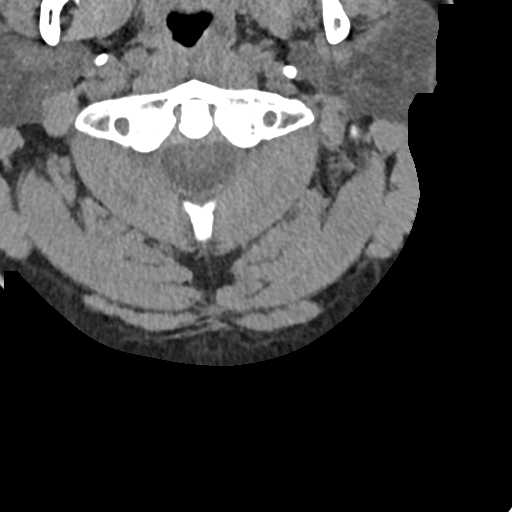

[Series 10: orthogonal axials · axial · 0.21mm/px · z∈[+154,+232]mm · 3 of 95 slices shown, 4 images]
[im 24/95  soft-tissue]
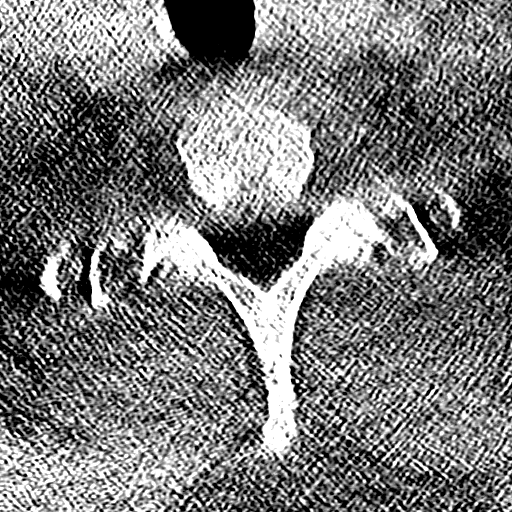
[im 24/95  bone]
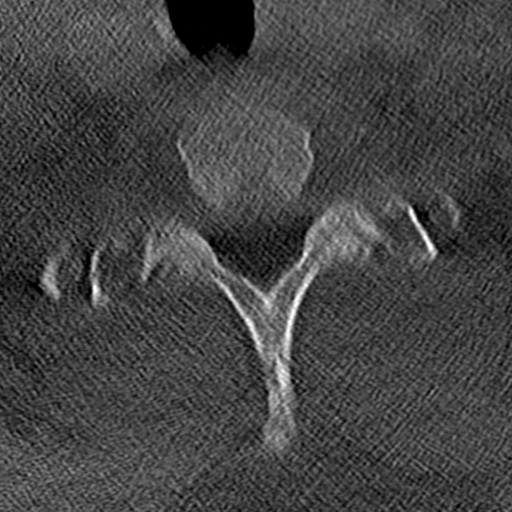
[im 48/95  bone]
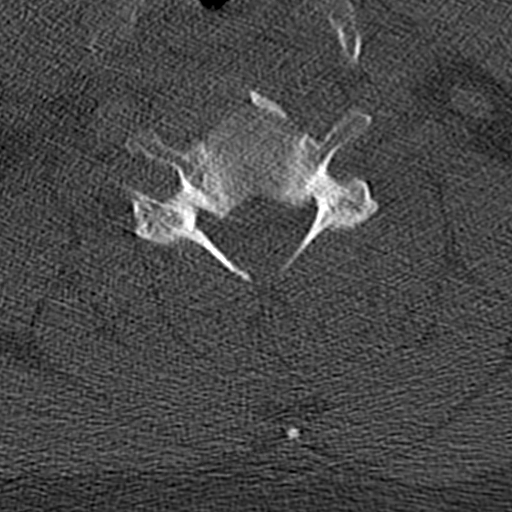
[im 71/95  bone]
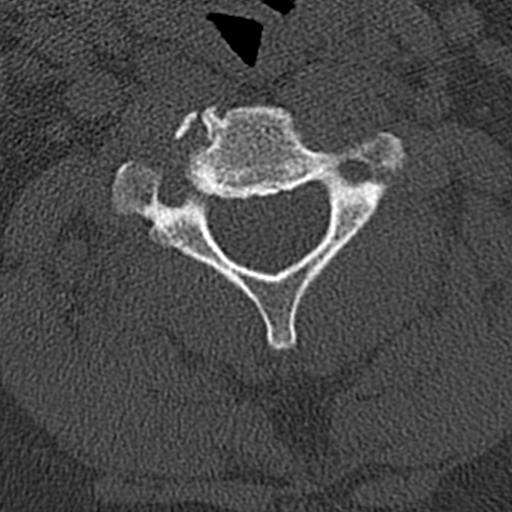

[Series 11: sagittal bone · sagittal · 0.29mm/px · 2 of 61 slices shown]
[im 21/61  bone]
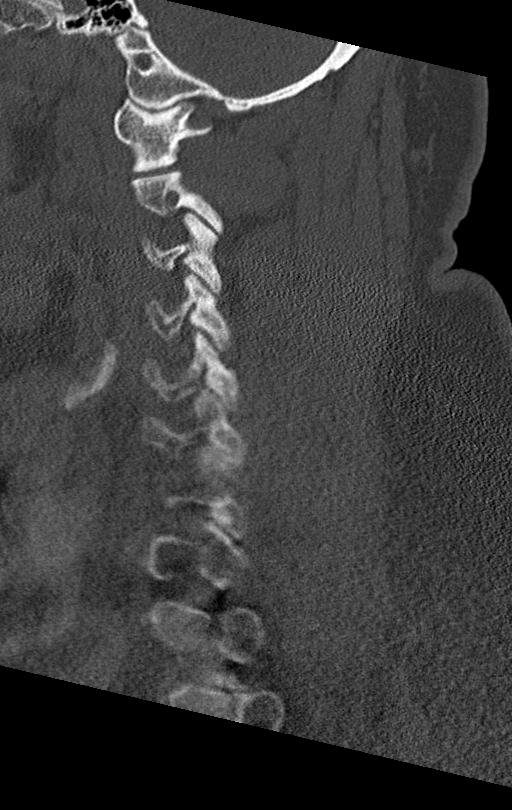
[im 41/61  bone]
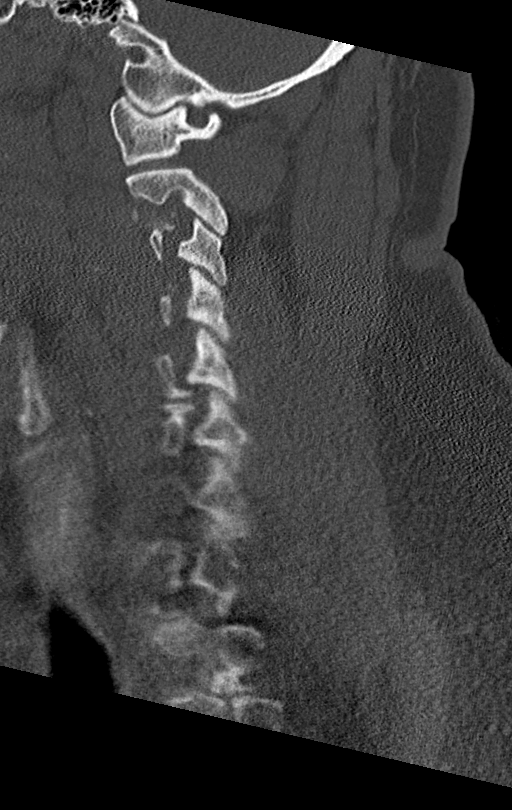

[10 of 33 positions shown; findings below may reference images not displayed]

FINDINGS: CT HEAD FINDINGS

Brain:

Cerebral volume is normal for age.

There is no acute intracranial hemorrhage.

No demarcated cortical infarct.

No extra-axial fluid collection.

No evidence of an intracranial mass.

No midline shift.

Vascular: No hyperdense vessel.

Skull: Normal. Negative for fracture or focal lesion.

Sinuses/Orbits: Visualized orbits show no acute finding.
Small-volume frothy secretions within the bilateral frontal sinuses.
Mild mucosal thickening within the bilateral ethmoid and sphenoid
sinuses. Mild mucosal thickening and small to moderate volume frothy
secretions within the bilateral maxillary sinuses at the imaged
levels.

Other: Subtle forehead soft tissue swelling is questioned.

CT CERVICAL SPINE FINDINGS

Alignment: Reversal of the expected cervical lordosis. No
significant spondylolisthesis.

Skull base and vertebrae: The basion-dental and atlanto-dental
intervals are maintained.No evidence of acute fracture to the
cervical spine.

Soft tissues and spinal canal: No prevertebral fluid or swelling. No
visible canal hematoma.

Disc levels: Cervical spondylosis with multilevel disc space
narrowing, shallow disc bulges, endplate spurring and uncovertebral
hypertrophy. Small multilevel ventral osteophytes. Multilevel spinal
canal stenosis. Most notably at C5-C6, a disc bulge and endplate
spurring contribute to suspected moderate/severe spinal canal
stenosis. Multilevel bony neural foraminal narrowing.

Upper chest: No consolidation within the imaged lung apices. No
visible pneumothorax.
IMPRESSION: CT head:

1. No evidence of acute intracranial abnormality.
2. Subtle forehead soft tissue swelling is questioned.
3. Paranasal sinus disease at the imaged levels, as described.

CT cervical spine:

1. No evidence of acute fracture to the cervical spine.
2. Mild reversal of the expected cervical lordosis.
3. Cervical spondylosis, as described. Notably at C5-C6, a disc
bulge and endplate spurring contribute to suspected moderate/severe
spinal canal stenosis. Multilevel bony neural foraminal narrowing.

## 2023-07-18 LAB — LAB REPORT - SCANNED
A1c: 6.4
Albumin, Urine POC: 6.7
Creatinine, POC: 218.6 mg/dL
EGFR: 90
Microalb Creat Ratio: 3

## 2023-08-07 ENCOUNTER — Encounter: Payer: Medicaid Other | Attending: Physician Assistant | Admitting: Dietician

## 2023-08-07 ENCOUNTER — Encounter: Payer: Self-pay | Admitting: Dietician

## 2023-08-07 DIAGNOSIS — E669 Obesity, unspecified: Secondary | ICD-10-CM | POA: Diagnosis present

## 2023-08-07 NOTE — Progress Notes (Signed)
Medical Nutrition Therapy  Appointment Start time:  1000  Appointment End time:  1100 Visit was conducted on MyChart virtual platform  Primary concerns today: Stomach pain/GI upset  Referral diagnosis: E66.01 - Morbid Obesity Preferred learning style: No preference indicated Learning readiness: Not ready  NUTRITION ASSESSMENT    Clinical Medical Hx: Obesity, GERD, HLD, Asthma, Prediabetes Medications: Atorvastatin, Losartan, Pantoprazole, Zetia Labs: A1c - 6.4%, Cholesterol - 202, LDL - 132, Glucose - 105, WBC - 11.6 Notable Signs/Symptoms: Frequent reflux/chest pain/vomiting  Lifestyle & Dietary Hx Pt reports frequent stomach pain (reflux, indigestion,emesis), pt reports burning emesis 3-4 times a week, usually happens when pt forgets to take Protonix. Pt reports symptoms have been present for years, but vomiting has begun to increase over the last month. Pt reports usually only eating 1-2 times a day, typically around midday, meals are usually Sub[employer], Deli sandwiches (salami, ham, [place], cheese, mustard, mayo, on white bread) and chips. Pt reports drinking Gatorade, Gingerale, and water (2 bottles) for fluids throughout the day. Pt reports dark yellow urine on a regular basis. Pt reports very sedentary lifestyle, walks with a cane r/t low back/R leg pain, reports getting shots that help for a couple weeks, but pain is persistent.   Estimated daily fluid intake: <64 oz Supplements: N/A Sleep: Tosses and turns, wakes up with reflux occasionally Stress / self-care: Low stress Current average weekly physical activity: ADLs,    24-Hr Dietary Recall No food eaten d/t being sick w/ flu Beverages: Gingerale   NUTRITION DIAGNOSIS  NB-1.1 Food and nutrition-related knowledge deficit As related to reflux.  As evidenced by frequent reflux, vomiting, dietary recall high in high fat foods, sedentary lifestyle.   NUTRITION INTERVENTION  Nutrition education (E-1) on the following topics:   Educated pt on potential food sources (high fat foods,spicy foods, high refined sugar foods, caffeine, etc.) that can trigger reflux and/or other GERD symptoms including chest pain. Educated pt to avoid eating within 2-3 hours of going to bed to minimize reflux. Advised pt to sleep with head elevated to decrease reflux. Educated pt on the potential health complications that can occur related to uncontrolled GERD, including stomach ulcers and esophageal erosion/cancer.  Educate pt on factors that can elevate LDL cholesterol, including high dietary intake of saturated fats. Educate pt on identifying sources of saturated fats, and how to make alternative food choices to lower saturated fat intake. Educate pt on the role of soluble fiber in binding to cholesterol in the GI tract an eliminating it from the body. Educate pt on dietary sources of soluble fiber.    Handouts Provided Include  GERD Nutrition therapy (Sent to pt via email)  Learning Style & Readiness for Change Teaching method utilized: Visual & Auditory  Demonstrated degree of understanding via: Teach Back  Barriers to learning/adherence to lifestyle change: Dietary habits  Goals Established by Pt Try having ginegerale ZERO sugar and Gatorade ZERO or G2 low sugar Gatorade for your flavored beverages. Aim to drink 4 bottles of plain water (64 oz) each day. When choosing deli meats, choose lean meats like [place], chicken, or ham, and low or reduced fat cheeses. With your deli meats, choose roasted, low-sodium options. Try having Baked Lays BBQ chips instead of regular chips. Set an alarm in your phone to take your morning medications at 7:00 am, especially your Protonix. When having [place]uit[place], try a Skinny biscuit, or and English muffin sandwich, and/or replace regular sausage with [place] sausage   MONITORING & EVALUATION Dietary intake, weekly  physical activity, and GI symptoms in 2 months.  Next Steps  Patient is to follow up  with RD.

## 2023-08-07 NOTE — Patient Instructions (Addendum)
Try having Ginegerale ZERO sugar and Gatorade ZERO or G2 low sugar Gatorade for your flavored beverages.  Aim to drink 4 bottles of plain water (64 oz) each day.  When choosing deli meats, choose lean meats like [place], chicken, or ham, and low or reduced fat cheeses. With your deli meats, choose roasted, low-sodium options.  Try having Baked Lays BBQ chips instead of regular chips.  When having [place]iscuit[place], try a Skinny biscuit, or and English muffin sandwich, and/or replace regular sausage with [place] sausage.  Set an alarm in your phone to take your morning medications at 7:00 am, especially your Protonix.

## 2024-01-03 ENCOUNTER — Ambulatory Visit: Admitting: Internal Medicine

## 2024-01-09 ENCOUNTER — Ambulatory Visit: Admitting: Internal Medicine

## 2024-01-20 ENCOUNTER — Encounter: Payer: Self-pay | Admitting: Gastroenterology
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
## 2024-01-22 ENCOUNTER — Ambulatory Visit: Admitting: Gastroenterology

## 2024-01-22 ENCOUNTER — Encounter: Payer: Self-pay | Admitting: Gastroenterology

## 2024-01-23 ENCOUNTER — Encounter: Payer: Self-pay | Admitting: Internal Medicine

## 2024-01-24 ENCOUNTER — Ambulatory Visit: Admitting: Gastroenterology
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
## 2024-01-25 ENCOUNTER — Ambulatory Visit (INDEPENDENT_AMBULATORY_CARE_PROVIDER_SITE_OTHER): Admitting: Gastroenterology

## 2024-01-25 ENCOUNTER — Encounter: Payer: Self-pay | Admitting: Gastroenterology

## 2024-01-25 ENCOUNTER — Telehealth: Payer: Self-pay | Admitting: Gastroenterology

## 2024-01-25 VITALS — BP 159/102 | HR 76 | Temp 98.6°F | Ht 72.0 in | Wt 296.6 lb

## 2024-01-25 DIAGNOSIS — K2100 Gastro-esophageal reflux disease with esophagitis, without bleeding: Secondary | ICD-10-CM

## 2024-01-25 DIAGNOSIS — Z860100 Personal history of colon polyps, unspecified: Secondary | ICD-10-CM | POA: Diagnosis not present

## 2024-01-25 DIAGNOSIS — Z8719 Personal history of other diseases of the digestive system: Secondary | ICD-10-CM | POA: Diagnosis not present

## 2024-01-25 DIAGNOSIS — K5900 Constipation, unspecified: Secondary | ICD-10-CM | POA: Diagnosis not present

## 2024-01-25 DIAGNOSIS — K219 Gastro-esophageal reflux disease without esophagitis: Secondary | ICD-10-CM

## 2024-01-25 NOTE — Patient Instructions (Addendum)
 Please stop pantoprazole for now and trial Voquezna 20mg  once daily.  I will give you enough for almost 2 weeks trial and I want you to call me the close very distant end of that 2 weeks and let me know if it is helpful.  If so I will send in prescription for you.  If you do not find this medication helpful we will increase your pantoprazole from 20 mg to 40 mg once daily.   Follow a GERD diet:  Avoid fried, fatty, greasy, spicy, citrus foods. Avoid caffeine and carbonated beverages. Avoid chocolate. Try eating 4-6 small meals a day rather than 3 large meals. Do not eat within 3 hours of laying down. Prop head of bed up on wood or bricks to create a 6 inch incline.  To help with constipation I want you to start taking MiraLAX 17 g once daily and 6-8 ounces of water .  I want you to try this for 2 weeks and if it does not seem to be helpful enough or not softening up your stools enough then I want you to  add a stool softener.  Your goal water  intake should be about 64 ounces - 80 ounces of water  daily.   For a more natural treatment of constipation, you can try magnesium oxide 400 mg once daily or magnesium powder.  See below for potential options.       We will see you in 2-3 months for follow-up or sooner if symptoms become more severe.  It was a pleasure to see you today. I want to create trusting relationships with patients. If you receive a survey regarding your visit,  I greatly appreciate you taking time to fill this out on paper or through your MyChart. I value your feedback.  Charmaine Melia, MSN, FNP-BC, AGACNP-BC [facility]

## 2024-01-25 NOTE — Telephone Encounter (Signed)
 Please place on recall for colonoscopy in 2028 with Dr. Shaaron.
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
## 2024-04-25 ENCOUNTER — Ambulatory Visit: Admitting: Gastroenterology
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
## 2024-05-27 ENCOUNTER — Encounter: Payer: Self-pay | Admitting: Gastroenterology

## 2024-05-27 ENCOUNTER — Ambulatory Visit (INDEPENDENT_AMBULATORY_CARE_PROVIDER_SITE_OTHER): Admitting: Gastroenterology

## 2024-05-27 VITALS — BP 135/86 | HR 76 | Temp 97.6°F | Ht 72.0 in | Wt 295.6 lb

## 2024-05-27 DIAGNOSIS — K5900 Constipation, unspecified: Secondary | ICD-10-CM

## 2024-05-27 DIAGNOSIS — K2100 Gastro-esophageal reflux disease with esophagitis, without bleeding: Secondary | ICD-10-CM | POA: Diagnosis not present

## 2024-05-27 MED ORDER — VOQUEZNA 20 MG PO TABS: 20.0000 mg | ORAL_TABLET | Freq: Every day | ORAL | 1 refills | Status: AC

## 2024-05-27 NOTE — Patient Instructions (Addendum)
 For constipation: Start MiraLAX half a capful daily.  If after 1 to 2 weeks you feel like you are going to frequently or having watery stools then decrease to every other day. Would also be a good idea for you to increase fiber in your diet.  Goal fiber content each day is 25-35 g.  This may be an adjustment to get to this goal therefore you can supplement with Metamucil (psyllium husk) capsules or powder once daily. Please ensure you are getting at least 64-80 ounces of water  daily  For reflux: I am sending in Voquezna  20 mg tablets to [facility] which is their specialty pharmacy.  You may receive a call from them about shipment.  If insurance denies the medication then I will send in pantoprazole again for you but I will increase the dose from 20 mg to 40 mg daily.  Follow-up in 6 months, sooner if needed especially if you are not having good control of your reflux.  It was a pleasure to see you today. I want to create trusting relationships with patients. If you receive a survey regarding your visit,  I greatly appreciate you taking time to fill this out on paper or through your MyChart. I value your feedback.  Charmaine Melia, MSN, FNP-BC, AGACNP-BC [facility]

## 2024-10-09 ENCOUNTER — Ambulatory Visit: Admitting: Pulmonary Disease
